# Patient Record
Sex: Female | Born: 1965 | Race: White | Hispanic: No | Marital: Married | Smoking: Never smoker
Health system: Southern US, Community
[De-identification: ages and names within clinical notes are randomized; demographics above are authoritative.]

## PROBLEM LIST (undated history)

## (undated) DIAGNOSIS — R112 Nausea with vomiting, unspecified: Secondary | ICD-10-CM

## (undated) DIAGNOSIS — K219 Gastro-esophageal reflux disease without esophagitis: Secondary | ICD-10-CM

## (undated) DIAGNOSIS — Z9889 Other specified postprocedural states: Secondary | ICD-10-CM

## (undated) DIAGNOSIS — Z8489 Family history of other specified conditions: Secondary | ICD-10-CM

## (undated) HISTORY — PX: DILATION AND CURETTAGE OF UTERUS: SHX78

## (undated) HISTORY — PX: RHINOPLASTY: SUR1284

## (undated) HISTORY — PX: EYE SURGERY: SHX253

---

## 2001-04-11 ENCOUNTER — Other Ambulatory Visit: Admission: RE | Admit: 2001-04-11 | Discharge: 2001-04-11 | Payer: Self-pay | Admitting: Obstetrics and Gynecology

## 2001-11-06 ENCOUNTER — Other Ambulatory Visit: Admission: RE | Admit: 2001-11-06 | Discharge: 2001-11-06 | Payer: Self-pay | Admitting: Obstetrics and Gynecology

## 2003-06-07 ENCOUNTER — Inpatient Hospital Stay (HOSPITAL_COMMUNITY): Admission: AD | Admit: 2003-06-07 | Discharge: 2003-06-09 | Payer: Self-pay | Admitting: Obstetrics

## 2008-03-26 ENCOUNTER — Encounter: Admission: RE | Admit: 2008-03-26 | Discharge: 2008-03-26 | Payer: Self-pay | Admitting: Obstetrics and Gynecology

## 2009-03-27 ENCOUNTER — Encounter: Admission: RE | Admit: 2009-03-27 | Discharge: 2009-03-27 | Payer: Self-pay | Admitting: Obstetrics and Gynecology

## 2010-03-31 ENCOUNTER — Encounter: Admission: RE | Admit: 2010-03-31 | Discharge: 2010-03-31 | Payer: Self-pay | Admitting: Obstetrics and Gynecology

## 2011-04-15 ENCOUNTER — Other Ambulatory Visit: Payer: Self-pay | Admitting: Obstetrics and Gynecology

## 2011-04-15 DIAGNOSIS — Z1231 Encounter for screening mammogram for malignant neoplasm of breast: Secondary | ICD-10-CM

## 2011-04-23 ENCOUNTER — Ambulatory Visit: Payer: Self-pay

## 2011-05-21 ENCOUNTER — Ambulatory Visit: Payer: Self-pay

## 2011-05-28 ENCOUNTER — Ambulatory Visit
Admission: RE | Admit: 2011-05-28 | Discharge: 2011-05-28 | Disposition: A | Discharge status: Home / Self Care | Payer: BC Managed Care – PPO | Source: Ambulatory Visit | Attending: Obstetrics and Gynecology | Admitting: Obstetrics and Gynecology

## 2011-05-28 DIAGNOSIS — Z1231 Encounter for screening mammogram for malignant neoplasm of breast: Secondary | ICD-10-CM

## 2012-02-14 ENCOUNTER — Other Ambulatory Visit: Payer: Self-pay | Admitting: Obstetrics and Gynecology

## 2012-02-14 DIAGNOSIS — N63 Unspecified lump in unspecified breast: Secondary | ICD-10-CM

## 2012-02-14 DIAGNOSIS — N6453 Retraction of nipple: Secondary | ICD-10-CM

## 2012-02-18 ENCOUNTER — Ambulatory Visit
Admission: RE | Admit: 2012-02-18 | Discharge: 2012-02-18 | Disposition: A | Discharge status: Home / Self Care | Payer: BC Managed Care – PPO | Source: Ambulatory Visit | Attending: Obstetrics and Gynecology | Admitting: Obstetrics and Gynecology

## 2012-02-18 DIAGNOSIS — N6453 Retraction of nipple: Secondary | ICD-10-CM

## 2012-02-18 DIAGNOSIS — N63 Unspecified lump in unspecified breast: Secondary | ICD-10-CM

## 2012-06-02 ENCOUNTER — Other Ambulatory Visit: Payer: Self-pay | Admitting: Obstetrics and Gynecology

## 2012-06-02 DIAGNOSIS — Z1231 Encounter for screening mammogram for malignant neoplasm of breast: Secondary | ICD-10-CM

## 2012-07-14 ENCOUNTER — Ambulatory Visit
Admission: RE | Admit: 2012-07-14 | Discharge: 2012-07-14 | Disposition: A | Discharge status: Home / Self Care | Payer: BC Managed Care – PPO | Source: Ambulatory Visit | Attending: Obstetrics and Gynecology | Admitting: Obstetrics and Gynecology

## 2012-07-14 DIAGNOSIS — Z1231 Encounter for screening mammogram for malignant neoplasm of breast: Secondary | ICD-10-CM

## 2013-10-04 ENCOUNTER — Ambulatory Visit: Payer: Self-pay | Admitting: Orthopedic Surgery

## 2013-12-13 ENCOUNTER — Other Ambulatory Visit: Payer: Self-pay | Admitting: Neurological Surgery

## 2014-01-07 ENCOUNTER — Other Ambulatory Visit: Payer: Self-pay

## 2014-01-07 DIAGNOSIS — Z1231 Encounter for screening mammogram for malignant neoplasm of breast: Secondary | ICD-10-CM

## 2014-01-10 ENCOUNTER — Ambulatory Visit: Payer: BC Managed Care – PPO

## 2014-01-14 ENCOUNTER — Ambulatory Visit: Payer: BC Managed Care – PPO

## 2014-01-17 ENCOUNTER — Encounter (HOSPITAL_COMMUNITY): Payer: Self-pay

## 2014-01-17 NOTE — Pre-Procedure Instructions (Signed)
Clarita CraneLisa Bracher  01/17/2014   Your procedure is scheduled on: Wednesday, August 5th  Report to [facility] Admitting at 1:00 PM.  Call this number if you have problems the morning of surgery: [phone]   Remember:   Do not eat food or drink liquids after midnight.   Take these medicines the morning of surgery with A SIP OF WATER: none   Do not wear jewelry, make-up or nail polish.  Do not wear lotions, powders, or perfumes. You may wear deodorant.  Do not shave 48 hours prior to surgery. Men may shave face and neck.  Do not bring valuables to the hospital.  [facility] is not responsible for any belongings or valuables.               Contacts, dentures or bridgework may not be worn into surgery.  Leave suitcase in the car. After surgery it may be brought to your room.  For patients admitted to the hospital, discharge time is determined by your treatment team.               Patients discharged the day of surgery will not be allowed to drive home.  Please read over the following fact sheets that you were given: Pain Booklet, Coughing and Deep Breathing, MRSA Information and Surgical Site Infection Prevention [place] - Preparing for Surgery  Before surgery, you can play an important role.  Because skin is not sterile, your skin needs to be as free of germs as possible.  You can reduce the number of germs on you skin by washing with CHG (chlorahexidine gluconate) soap before surgery.  CHG is an antiseptic cleaner which kills germs and bonds with the skin to continue killing germs even after washing.  Please DO NOT use if you have an allergy to CHG or antibacterial soaps.  If your skin becomes reddened/irritated stop using the CHG and inform your nurse when you arrive at Short Stay.  Do not shave (including legs and underarms) for at least 48 hours prior to the first CHG shower.  You may shave your face.  Please follow these instructions carefully:   1.  Shower with CHG Soap  the night before surgery and the morning of Surgery.  2.  If you choose to wash your hair, wash your hair first as usual with your normal shampoo.  3.  After you shampoo, rinse your hair and body thoroughly to remove the shampoo.  4.  Use CHG as you would any other liquid soap.  You can apply CHG directly to the skin and wash gently with scrungie or a clean washcloth.  5.  Apply the CHG Soap to your body ONLY FROM THE NECK DOWN.  Do not use on open wounds or open sores.  Avoid contact with your eyes, ears, mouth and genitals (private parts).  Wash genitals (private parts) with your normal soap.  6.  Wash thoroughly, paying special attention to the area where your surgery will be performed.  7.  Thoroughly rinse your body with warm water from the neck down.  8.  DO NOT shower/wash with your normal soap after using and rinsing off the CHG Soap.  9.  Pat yourself dry with a clean towel.            10.  Wear clean pajamas.            11.  Place clean sheets on your bed the night of your first shower and do  not sleep with pets.  Day of Surgery  Do not apply any lotions/deoderants the morning of surgery.  Please wear clean clothes to the hospital/surgery center.

## 2014-01-18 ENCOUNTER — Encounter (HOSPITAL_COMMUNITY)
Admission: RE | Admit: 2014-01-18 | Discharge: 2014-01-18 | Disposition: A | Discharge status: Home / Self Care | Payer: BC Managed Care – PPO | Source: Ambulatory Visit | Attending: Neurological Surgery | Admitting: Neurological Surgery

## 2014-01-18 ENCOUNTER — Encounter (HOSPITAL_COMMUNITY): Payer: Self-pay

## 2014-01-18 DIAGNOSIS — Z01818 Encounter for other preprocedural examination: Secondary | ICD-10-CM | POA: Insufficient documentation

## 2014-01-18 DIAGNOSIS — Z01812 Encounter for preprocedural laboratory examination: Secondary | ICD-10-CM | POA: Insufficient documentation

## 2014-01-18 DIAGNOSIS — Z01810 Encounter for preprocedural cardiovascular examination: Secondary | ICD-10-CM | POA: Insufficient documentation

## 2014-01-18 HISTORY — DX: Gastro-esophageal reflux disease without esophagitis: K21.9

## 2014-01-18 HISTORY — DX: Nausea with vomiting, unspecified: R11.2

## 2014-01-18 HISTORY — DX: Other specified postprocedural states: Z98.890

## 2014-01-18 HISTORY — DX: Family history of other specified conditions: Z84.89

## 2014-01-18 LAB — SURGICAL PCR SCREEN
MRSA, PCR: NEGATIVE
Staphylococcus aureus: NEGATIVE

## 2014-01-18 LAB — CBC WITH DIFFERENTIAL/PLATELET
BASOS ABS: 0 10*3/uL (ref 0.0–0.1)
Basophils Relative: 1 % (ref 0–1)
EOS ABS: 0.4 10*3/uL (ref 0.0–0.7)
EOS PCT: 7 % — AB (ref 0–5)
HEMATOCRIT: 38.9 % (ref 36.0–46.0)
Hemoglobin: 13 g/dL (ref 12.0–15.0)
LYMPHS PCT: 32 % (ref 12–46)
Lymphs Abs: 1.8 10*3/uL (ref 0.7–4.0)
MCH: 30.7 pg (ref 26.0–34.0)
MCHC: 33.4 g/dL (ref 30.0–36.0)
MCV: 91.7 fL (ref 78.0–100.0)
MONO ABS: 0.5 10*3/uL (ref 0.1–1.0)
Monocytes Relative: 8 % (ref 3–12)
Neutro Abs: 3 10*3/uL (ref 1.7–7.7)
Neutrophils Relative %: 52 % (ref 43–77)
PLATELETS: 321 10*3/uL (ref 150–400)
RBC: 4.24 MIL/uL (ref 3.87–5.11)
RDW: 12.9 % (ref 11.5–15.5)
WBC: 5.7 10*3/uL (ref 4.0–10.5)

## 2014-01-18 LAB — BASIC METABOLIC PANEL
Anion gap: 14 (ref 5–15)
BUN: 18 mg/dL (ref 6–23)
CALCIUM: 9.4 mg/dL (ref 8.4–10.5)
CO2: 25 meq/L (ref 19–32)
CREATININE: 0.59 mg/dL (ref 0.50–1.10)
Chloride: 102 mEq/L (ref 96–112)
GFR calc Af Amer: >90 mL/min (ref >90)
GLUCOSE: 86 mg/dL (ref 70–99)
Potassium: 4.1 mEq/L (ref 3.7–5.3)
Sodium: 141 mEq/L (ref 137–147)

## 2014-01-18 LAB — PROTIME-INR
INR: 0.94 (ref 0.00–1.49)
Prothrombin Time: 12.6 seconds (ref 11.6–15.2)

## 2014-01-18 LAB — HCG, SERUM, QUALITATIVE: Preg, Serum: NEGATIVE

## 2014-01-29 MED ORDER — DEXAMETHASONE SODIUM PHOSPHATE 10 MG/ML IJ SOLN
10.0000 mg | INTRAMUSCULAR | Status: AC
  Administered 2014-01-30: 10 mg via INTRAVENOUS
  Filled 2014-01-29: qty 1

## 2014-01-29 MED ORDER — CEFAZOLIN SODIUM-DEXTROSE 2-3 GM-% IV SOLR
2.0000 g | INTRAVENOUS | Status: AC
  Administered 2014-01-30: 2 g via INTRAVENOUS
  Filled 2014-01-29: qty 50

## 2014-01-30 ENCOUNTER — Encounter (HOSPITAL_COMMUNITY): Payer: Self-pay | Admitting: *Deleted

## 2014-01-30 ENCOUNTER — Ambulatory Visit (HOSPITAL_COMMUNITY): Payer: BC Managed Care – PPO | Admitting: Certified Registered Nurse Anesthetist

## 2014-01-30 ENCOUNTER — Encounter (HOSPITAL_COMMUNITY): Payer: BC Managed Care – PPO | Admitting: Certified Registered Nurse Anesthetist

## 2014-01-30 ENCOUNTER — Ambulatory Visit (HOSPITAL_COMMUNITY)
Admission: RE | Admit: 2014-01-30 | Discharge: 2014-01-31 | Disposition: A | Discharge status: Home / Self Care | Payer: BC Managed Care – PPO | Source: Ambulatory Visit | Attending: Neurological Surgery | Admitting: Neurological Surgery

## 2014-01-30 ENCOUNTER — Encounter (HOSPITAL_COMMUNITY)
Admission: RE | Disposition: A | Discharge status: Home / Self Care | Payer: Self-pay | Source: Ambulatory Visit | Attending: Neurological Surgery

## 2014-01-30 ENCOUNTER — Ambulatory Visit (HOSPITAL_COMMUNITY): Payer: BC Managed Care – PPO

## 2014-01-30 DIAGNOSIS — M5126 Other intervertebral disc displacement, lumbar region: Secondary | ICD-10-CM | POA: Diagnosis present

## 2014-01-30 DIAGNOSIS — Z981 Arthrodesis status: Secondary | ICD-10-CM

## 2014-01-30 DIAGNOSIS — Z6831 Body mass index (BMI) 31.0-31.9, adult: Secondary | ICD-10-CM | POA: Insufficient documentation

## 2014-01-30 DIAGNOSIS — K219 Gastro-esophageal reflux disease without esophagitis: Secondary | ICD-10-CM | POA: Insufficient documentation

## 2014-01-30 DIAGNOSIS — G8929 Other chronic pain: Secondary | ICD-10-CM | POA: Insufficient documentation

## 2014-01-30 HISTORY — PX: LUMBAR LAMINECTOMY/DECOMPRESSION MICRODISCECTOMY: SHX5026

## 2014-01-30 SURGERY — LUMBAR LAMINECTOMY/DECOMPRESSION MICRODISCECTOMY 1 LEVEL
Anesthesia: General | Site: Back | Laterality: Left

## 2014-01-30 MED ORDER — PHENYLEPHRINE HCL 10 MG/ML IJ SOLN
INTRAMUSCULAR | Status: DC | PRN
  Administered 2014-01-30: 80 ug via INTRAVENOUS

## 2014-01-30 MED ORDER — ACETAMINOPHEN 650 MG RE SUPP: 650.0000 mg | RECTAL | Status: DC | PRN

## 2014-01-30 MED ORDER — DEXAMETHASONE SODIUM PHOSPHATE 4 MG/ML IJ SOLN
4.0000 mg | Freq: Four times a day (QID) | INTRAMUSCULAR | Status: DC
  Filled 2014-01-30 (×4): qty 1

## 2014-01-30 MED ORDER — GLYCOPYRROLATE 0.2 MG/ML IJ SOLN
INTRAMUSCULAR | Status: DC | PRN
  Administered 2014-01-30: 0.4 mg via INTRAVENOUS

## 2014-01-30 MED ORDER — GLYCOPYRROLATE 0.2 MG/ML IJ SOLN
INTRAMUSCULAR | Status: AC
  Filled 2014-01-30: qty 2

## 2014-01-30 MED ORDER — FENTANYL CITRATE 0.05 MG/ML IJ SOLN
INTRAMUSCULAR | Status: AC
  Filled 2014-01-30: qty 5

## 2014-01-30 MED ORDER — SCOPOLAMINE 1 MG/3DAYS TD PT72
MEDICATED_PATCH | TRANSDERMAL | Status: DC | PRN
  Administered 2014-01-30: 1 via TRANSDERMAL

## 2014-01-30 MED ORDER — HEMOSTATIC AGENTS (NO CHARGE) OPTIME
TOPICAL | Status: DC | PRN
  Administered 2014-01-30: 1 via TOPICAL

## 2014-01-30 MED ORDER — DEXAMETHASONE 4 MG PO TABS
4.0000 mg | ORAL_TABLET | Freq: Four times a day (QID) | ORAL | Status: DC
  Administered 2014-01-30 – 2014-01-31 (×4): 4 mg via ORAL
  Filled 2014-01-30 (×5): qty 1

## 2014-01-30 MED ORDER — NEOSTIGMINE METHYLSULFATE 10 MG/10ML IV SOLN
INTRAVENOUS | Status: DC | PRN
  Administered 2014-01-30: 3 mg via INTRAVENOUS

## 2014-01-30 MED ORDER — SODIUM CHLORIDE 0.9 % IJ SOLN: 3.0000 mL | INTRAMUSCULAR | Status: DC | PRN

## 2014-01-30 MED ORDER — MEPERIDINE HCL 25 MG/ML IJ SOLN: 6.2500 mg | INTRAMUSCULAR | Status: DC | PRN

## 2014-01-30 MED ORDER — NEOSTIGMINE METHYLSULFATE 10 MG/10ML IV SOLN
INTRAVENOUS | Status: AC
  Filled 2014-01-30: qty 1

## 2014-01-30 MED ORDER — LIDOCAINE HCL (CARDIAC) 20 MG/ML IV SOLN
INTRAVENOUS | Status: AC
  Filled 2014-01-30: qty 5

## 2014-01-30 MED ORDER — ROCURONIUM BROMIDE 50 MG/5ML IV SOLN
INTRAVENOUS | Status: AC
  Filled 2014-01-30: qty 1

## 2014-01-30 MED ORDER — ONDANSETRON HCL 4 MG/2ML IJ SOLN: 4.0000 mg | INTRAMUSCULAR | Status: DC | PRN

## 2014-01-30 MED ORDER — MORPHINE SULFATE 2 MG/ML IJ SOLN: 1.0000 mg | INTRAMUSCULAR | Status: DC | PRN

## 2014-01-30 MED ORDER — DIPHENHYDRAMINE HCL 50 MG/ML IJ SOLN: 12.5000 mg | Freq: Once | INTRAMUSCULAR | Status: DC

## 2014-01-30 MED ORDER — OXYCODONE HCL 5 MG/5ML PO SOLN: 5.0000 mg | Freq: Once | ORAL | Status: DC | PRN

## 2014-01-30 MED ORDER — ONDANSETRON HCL 4 MG/2ML IJ SOLN
INTRAMUSCULAR | Status: DC | PRN
  Administered 2014-01-30: 4 mg via INTRAVENOUS

## 2014-01-30 MED ORDER — HYDROMORPHONE HCL PF 1 MG/ML IJ SOLN
INTRAMUSCULAR | Status: AC
  Filled 2014-01-30: qty 1

## 2014-01-30 MED ORDER — LACTATED RINGERS IV SOLN
INTRAVENOUS | Status: DC
  Administered 2014-01-30: 14:00:00 via INTRAVENOUS

## 2014-01-30 MED ORDER — PROMETHAZINE HCL 25 MG/ML IJ SOLN: 6.2500 mg | INTRAMUSCULAR | Status: DC | PRN

## 2014-01-30 MED ORDER — MIDAZOLAM HCL 2 MG/2ML IJ SOLN: 0.5000 mg | Freq: Once | INTRAMUSCULAR | Status: DC | PRN

## 2014-01-30 MED ORDER — ACETAMINOPHEN 325 MG PO TABS
650.0000 mg | ORAL_TABLET | ORAL | Status: DC | PRN
  Administered 2014-01-31: 650 mg via ORAL
  Filled 2014-01-30: qty 2

## 2014-01-30 MED ORDER — SCOPOLAMINE 1 MG/3DAYS TD PT72
1.0000 | MEDICATED_PATCH | Freq: Once | TRANSDERMAL | Status: DC
  Administered 2014-01-30: 1.5 mg via TRANSDERMAL

## 2014-01-30 MED ORDER — 0.9 % SODIUM CHLORIDE (POUR BTL) OPTIME
TOPICAL | Status: DC | PRN
  Administered 2014-01-30: 1000 mL

## 2014-01-30 MED ORDER — FENTANYL CITRATE 0.05 MG/ML IJ SOLN
INTRAMUSCULAR | Status: DC | PRN
  Administered 2014-01-30 (×3): 50 ug via INTRAVENOUS
  Administered 2014-01-30: 100 ug via INTRAVENOUS

## 2014-01-30 MED ORDER — BUPIVACAINE HCL (PF) 0.25 % IJ SOLN
INTRAMUSCULAR | Status: DC | PRN
  Administered 2014-01-30: 2 mL

## 2014-01-30 MED ORDER — SCOPOLAMINE 1 MG/3DAYS TD PT72
MEDICATED_PATCH | TRANSDERMAL | Status: AC
  Filled 2014-01-30: qty 1

## 2014-01-30 MED ORDER — PROPOFOL 10 MG/ML IV BOLUS
INTRAVENOUS | Status: AC
  Filled 2014-01-30: qty 20

## 2014-01-30 MED ORDER — CEFAZOLIN SODIUM 1-5 GM-% IV SOLN
1.0000 g | Freq: Three times a day (TID) | INTRAVENOUS | Status: AC
  Administered 2014-01-30 – 2014-01-31 (×2): 1 g via INTRAVENOUS
  Filled 2014-01-30 (×2): qty 50

## 2014-01-30 MED ORDER — MIDAZOLAM HCL 2 MG/2ML IJ SOLN
INTRAMUSCULAR | Status: AC
  Filled 2014-01-30: qty 2

## 2014-01-30 MED ORDER — SUCCINYLCHOLINE CHLORIDE 20 MG/ML IJ SOLN
INTRAMUSCULAR | Status: AC
  Filled 2014-01-30: qty 1

## 2014-01-30 MED ORDER — MENTHOL 3 MG MT LOZG: 1.0000 | LOZENGE | OROMUCOSAL | Status: DC | PRN

## 2014-01-30 MED ORDER — ROCURONIUM BROMIDE 100 MG/10ML IV SOLN
INTRAVENOUS | Status: DC | PRN
  Administered 2014-01-30: 40 mg via INTRAVENOUS

## 2014-01-30 MED ORDER — HYDROMORPHONE HCL PF 1 MG/ML IJ SOLN
0.2500 mg | INTRAMUSCULAR | Status: DC | PRN
  Administered 2014-01-30 (×2): 0.5 mg via INTRAVENOUS

## 2014-01-30 MED ORDER — LACTATED RINGERS IV SOLN
INTRAVENOUS | Status: DC | PRN
  Administered 2014-01-30 (×2): via INTRAVENOUS

## 2014-01-30 MED ORDER — OXYCODONE HCL 5 MG PO TABS: 5.0000 mg | ORAL_TABLET | Freq: Once | ORAL | Status: DC | PRN

## 2014-01-30 MED ORDER — SODIUM CHLORIDE 0.9 % IR SOLN
Status: DC | PRN
  Administered 2014-01-30: 17:00:00

## 2014-01-30 MED ORDER — PHENOL 1.4 % MT LIQD: 1.0000 | OROMUCOSAL | Status: DC | PRN

## 2014-01-30 MED ORDER — SODIUM CHLORIDE 0.9 % IV SOLN: 250.0000 mL | INTRAVENOUS | Status: DC

## 2014-01-30 MED ORDER — DIPHENHYDRAMINE HCL 50 MG/ML IJ SOLN
INTRAMUSCULAR | Status: DC | PRN
  Administered 2014-01-30: 12.5 mg via INTRAVENOUS

## 2014-01-30 MED ORDER — SODIUM CHLORIDE 0.9 % IJ SOLN: 3.0000 mL | Freq: Two times a day (BID) | INTRAMUSCULAR | Status: DC

## 2014-01-30 MED ORDER — THROMBIN 5000 UNITS EX SOLR
CUTANEOUS | Status: DC | PRN
  Administered 2014-01-30 (×2): 5000 [IU] via TOPICAL

## 2014-01-30 MED ORDER — ONDANSETRON HCL 4 MG/2ML IJ SOLN
INTRAMUSCULAR | Status: AC
  Filled 2014-01-30: qty 4

## 2014-01-30 MED ORDER — PROPOFOL 10 MG/ML IV BOLUS
INTRAVENOUS | Status: DC | PRN
  Administered 2014-01-30: 150 mg via INTRAVENOUS

## 2014-01-30 MED ORDER — MIDAZOLAM HCL 5 MG/5ML IJ SOLN
INTRAMUSCULAR | Status: DC | PRN
  Administered 2014-01-30: 2 mg via INTRAVENOUS

## 2014-01-30 MED ORDER — LIDOCAINE HCL (CARDIAC) 20 MG/ML IV SOLN
INTRAVENOUS | Status: DC | PRN
  Administered 2014-01-30: 30 mg via INTRAVENOUS

## 2014-01-30 MED ORDER — CYCLOBENZAPRINE HCL 10 MG PO TABS: 10.0000 mg | ORAL_TABLET | Freq: Three times a day (TID) | ORAL | Status: DC | PRN

## 2014-01-30 MED ORDER — POTASSIUM CHLORIDE IN NACL 20-0.9 MEQ/L-% IV SOLN
INTRAVENOUS | Status: DC
  Filled 2014-01-30 (×3): qty 1000

## 2014-01-30 MED ORDER — HYDROCODONE-ACETAMINOPHEN 5-325 MG PO TABS: 1.0000 | ORAL_TABLET | ORAL | Status: DC | PRN

## 2014-01-30 SURGICAL SUPPLY — 47 items
BAG DECANTER FOR FLEXI CONT (MISCELLANEOUS) ×3 IMPLANT
BENZOIN TINCTURE PRP APPL 2/3 (GAUZE/BANDAGES/DRESSINGS) ×3 IMPLANT
BUR MATCHSTICK NEURO 3.0 LAGG (BURR) ×3 IMPLANT
CANISTER SUCT 3000ML (MISCELLANEOUS) ×3 IMPLANT
CLOSURE WOUND 1/2 X4 (GAUZE/BANDAGES/DRESSINGS) ×1
CONT SPEC 4OZ CLIKSEAL STRL BL (MISCELLANEOUS) ×3 IMPLANT
DRAPE LAPAROTOMY 100X72X124 (DRAPES) ×3 IMPLANT
DRAPE MICROSCOPE LEICA (MISCELLANEOUS) ×3 IMPLANT
DRAPE POUCH INSTRU U-SHP 10X18 (DRAPES) ×3 IMPLANT
DRAPE SURG 17X23 STRL (DRAPES) ×3 IMPLANT
DRSG OPSITE 4X5.5 SM (GAUZE/BANDAGES/DRESSINGS) ×3 IMPLANT
DRSG OPSITE POSTOP 4X6 (GAUZE/BANDAGES/DRESSINGS) ×3 IMPLANT
DRSG TELFA 3X8 NADH (GAUZE/BANDAGES/DRESSINGS) ×3 IMPLANT
DURAPREP 26ML APPLICATOR (WOUND CARE) ×3 IMPLANT
ELECT REM PT RETURN 9FT ADLT (ELECTROSURGICAL) ×3
ELECTRODE REM PT RTRN 9FT ADLT (ELECTROSURGICAL) ×1 IMPLANT
GAUZE SPONGE 4X4 16PLY XRAY LF (GAUZE/BANDAGES/DRESSINGS) IMPLANT
GLOVE BIO SURGEON STRL SZ 6.5 (GLOVE) ×6 IMPLANT
GLOVE BIO SURGEON STRL SZ8 (GLOVE) ×9 IMPLANT
GLOVE BIO SURGEONS STRL SZ 6.5 (GLOVE) ×3
GLOVE BIOGEL PI IND STRL 6.5 (GLOVE) ×1 IMPLANT
GLOVE BIOGEL PI INDICATOR 6.5 (GLOVE) ×2
GLOVE INDICATOR 8.5 STRL (GLOVE) ×3 IMPLANT
GOWN STRL REUS W/ TWL LRG LVL3 (GOWN DISPOSABLE) ×1 IMPLANT
GOWN STRL REUS W/ TWL XL LVL3 (GOWN DISPOSABLE) ×2 IMPLANT
GOWN STRL REUS W/TWL 2XL LVL3 (GOWN DISPOSABLE) IMPLANT
GOWN STRL REUS W/TWL LRG LVL3 (GOWN DISPOSABLE) ×2
GOWN STRL REUS W/TWL XL LVL3 (GOWN DISPOSABLE) ×4
HEMOSTAT POWDER KIT SURGIFOAM (HEMOSTASIS) ×3 IMPLANT
KIT BASIN OR (CUSTOM PROCEDURE TRAY) ×3 IMPLANT
KIT ROOM TURNOVER OR (KITS) ×3 IMPLANT
NEEDLE HYPO 25X1 1.5 SAFETY (NEEDLE) ×3 IMPLANT
NEEDLE SPNL 20GX3.5 QUINCKE YW (NEEDLE) IMPLANT
NS IRRIG 1000ML POUR BTL (IV SOLUTION) ×3 IMPLANT
PACK LAMINECTOMY NEURO (CUSTOM PROCEDURE TRAY) ×3 IMPLANT
PAD ARMBOARD 7.5X6 YLW CONV (MISCELLANEOUS) ×9 IMPLANT
RUBBERBAND STERILE (MISCELLANEOUS) ×6 IMPLANT
SPONGE SURGIFOAM ABS GEL SZ50 (HEMOSTASIS) ×3 IMPLANT
STRIP CLOSURE SKIN 1/2X4 (GAUZE/BANDAGES/DRESSINGS) ×2 IMPLANT
SUT VIC AB 0 CT1 18XCR BRD8 (SUTURE) ×1 IMPLANT
SUT VIC AB 0 CT1 8-18 (SUTURE) ×2
SUT VIC AB 2-0 CP2 18 (SUTURE) ×3 IMPLANT
SUT VIC AB 3-0 SH 8-18 (SUTURE) ×3 IMPLANT
SYR 20ML ECCENTRIC (SYRINGE) ×3 IMPLANT
TOWEL OR 17X24 6PK STRL BLUE (TOWEL DISPOSABLE) ×3 IMPLANT
TOWEL OR 17X26 10 PK STRL BLUE (TOWEL DISPOSABLE) ×3 IMPLANT
WATER STERILE IRR 1000ML POUR (IV SOLUTION) ×3 IMPLANT

## 2014-01-30 NOTE — H&P (Signed)
Subjective: Patient is a 48 y.o. female admitted for lumbar microdiscectomy L4-5. Onset of symptoms was several weeks ago, rapidly worsening since that time.  The pain is rated severe, and is located at the across the lower back and radiates to legs. The pain is described as aching and occurs all day. The symptoms have been progressive. Symptoms are exacerbated by exercise. MRI or CT showed herniated nucleus pulposus L4-5   Past Medical History  Diagnosis Date  . PONV (postoperative nausea and vomiting)   . Family history of anesthesia complication     mother has alot of allergies  . GERD (gastroesophageal reflux disease)     ?? d/t anti inflammatory    Past Surgical History  Procedure Laterality Date  . Rhinoplasty      20 yr ago  . Eye surgery      lasik in right eye  . Dilation and curettage of uterus      abortion  . Vaginal delivery       x 1    Prior to Admission medications   Medication Sig Start Date End Date Taking? Authorizing Provider  calcium carbonate (TUMS - DOSED IN MG ELEMENTAL CALCIUM) 500 MG chewable tablet Chew 2 tablets by mouth daily.   Yes Historical Provider, MD  Calcium Carbonate-Vitamin D (CALCIUM + D PO) Take 1 tablet by mouth daily.   Yes Historical Provider, MD  ibuprofen (ADVIL,MOTRIN) 200 MG tablet Take 600 mg by mouth every 6 (six) hours as needed for mild pain.   Yes Historical Provider, MD  Multiple Vitamins-Minerals (MULTIVITAMIN WITH MINERALS) tablet Take 1 tablet by mouth daily.   Yes Historical Provider, MD   No Known Allergies  History  Substance Use Topics  . Smoking status: Never Smoker   . Smokeless tobacco: Not on file  . Alcohol Use: 1.8 oz/week    3 Glasses of wine per week    History reviewed. No pertinent family history.   Review of Systems  Positive ROS: neg  All other systems have been reviewed and were otherwise negative with the exception of those mentioned in the HPI and as above.  Objective: Vital signs in last 24  hours: Temp:  [97.8 F (36.6 C)] 97.8 F (36.6 C) (08/05 1303) Resp:  [20] 20 (08/05 1303) BP: (120)/(65) 120/65 mmHg (08/05 1303) SpO2:  [97 %] 97 % (08/05 1303) Weight:  [77.111 kg (170 lb)] 77.111 kg (170 lb) (08/05 1303)  General Appearance: Alert, cooperative, no distress, appears stated age Head: Normocephalic, without obvious abnormality, atraumatic Eyes: PERRL, conjunctiva/corneas clear, EOM's intact    Neck: Supple, symmetrical, trachea midline Back: Symmetric, no curvature, ROM normal, no CVA tenderness Lungs:  respirations unlabored Heart: Regular rate and rhythm Abdomen: Soft, non-tender Extremities: Extremities normal, atraumatic, no cyanosis or edema Pulses: 2+ and symmetric all extremities Skin: Skin color, texture, turgor normal, no rashes or lesions  NEUROLOGIC:   Mental status: Alert and oriented x4,  no aphasia, good attention span, fund of knowledge, and memory Motor Exam - grossly normal Sensory Exam - grossly normal Reflexes: 1+ Coordination - grossly normal Gait - grossly normal Balance - grossly normal Cranial Nerves: I: smell Not tested  II: visual acuity  OS: nl    OD: nl  II: visual fields Full to confrontation  II: pupils Equal, round, reactive to light  III,VII: ptosis None  III,IV,VI: extraocular muscles  Full ROM  V: mastication Normal  V: facial light touch sensation  Normal  V,VII: corneal reflex  Present  VII: facial muscle function - upper  Normal  VII: facial muscle function - lower Normal  VIII: hearing Not tested  IX: soft palate elevation  Normal  IX,X: gag reflex Present  XI: trapezius strength  5/5  XI: sternocleidomastoid strength 5/5  XI: neck flexion strength  5/5  XII: tongue strength  Normal    Data Review Lab Results  Component Value Date   WBC 5.7 01/18/2014   HGB 13.0 01/18/2014   HCT 38.9 01/18/2014   MCV 91.7 01/18/2014   PLT 321 01/18/2014   Lab Results  Component Value Date   NA 141 01/18/2014   K 4.1  01/18/2014   CL 102 01/18/2014   CO2 25 01/18/2014   BUN 18 01/18/2014   CREATININE 0.59 01/18/2014   GLUCOSE 86 01/18/2014   Lab Results  Component Value Date   INR 0.94 01/18/2014    Assessment/Plan: Patient admitted for lumbar microdiscectomy. Patient has failed a reasonable attempt at conservative therapy.  I explained the condition and procedure to the patient and answered any questions.  Patient wishes to proceed with procedure as planned. Understands risks/ benefits and typical outcomes of procedure.   Dorothy Hernandez S 01/30/2014 3:32 PM

## 2014-01-30 NOTE — Plan of Care (Signed)
Problem: Consults Goal: Diagnosis - Spinal Surgery Outcome: Completed/Met Date Met:  01/30/14 Microdiscectomy     

## 2014-01-30 NOTE — Transfer of Care (Signed)
Immediate Anesthesia Transfer of Care Note  Patient: Dorothy CraneLisa Hernandez  Procedure(s) Performed: Procedure(s) with comments: LUMBAR LAMINECTOMY/DECOMPRESSION MICRODISCECTOMY Left Four-Five  (Left) - LUMBAR LAMINECTOMY/DECOMPRESSION MICRODISCECTOMY Left Four-Five   Patient Location: PACU  Anesthesia Type:General  Level of Consciousness: awake, alert  and patient cooperative  Airway & Oxygen Therapy: Patient Spontanous Breathing and Patient connected to nasal cannula oxygen  Post-op Assessment: Report given to PACU RN and Post -op Vital signs reviewed and stable  Post vital signs: Reviewed and stable  Complications: No apparent anesthesia complications

## 2014-01-30 NOTE — Op Note (Signed)
01/30/2014  5:38 PM  PATIENT:  Dorothy CraneLisa Hernandez  48 y.o. 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 Wynetta Emeryram  ANESTHESIA:   General  EBL: 100 ml  Total I/O In: 1000 [I.V.:1000] Out: 100 [Blood:100]  BLOOD ADMINISTERED:none  DRAINS: None   SPECIMEN:  No Specimen  INDICATION FOR PROCEDURE: This patient presented with severe left leg pain. She MRI which showed a large left-sided disc herniation at L4-5. She tried medical management without relief. I recommended left L4-5 microdiscectomy. Patient understood the risks, benefits, and alternatives and potential outcomes and wished to proceed.  PROCEDURE DETAILS: The patient was taken to the operating room and after induction of adequate generalized endotracheal anesthesia, the patient was rolled into the prone position on the Wilson frame and all pressure points were padded. The lumbar region was cleaned and then prepped with DuraPrep and draped in the usual sterile fashion. 5 cc of local anesthesia was injected and then a dorsal midline incision was made and carried down to the lumbo sacral fascia. The fascia was opened and the paraspinous musculature was taken down in a subperiosteal fashion to expose L4-5 on the left. Intraoperative x-ray confirmed my level, and then I used a combination of the high-speed drill and the Kerrison punches to perform a hemilaminectomy, medial facetectomy, and foraminotomy at L4-5 on the left. The underlying yellow ligament was opened and removed in a piecemeal fashion to expose the underlying dura and exiting nerve root. I undercut the lateral recess and dissected down until I was medial to and distal to the pedicle. The nerve root was well decompressed.  We then gently retracted the nerve root medially with a retractor, coagulated the epidural venous vasculature, and incised the disc space. I performed a thorough intradiscal discectomy with pituitary rongeurs and curettes, until I had a nice decompression of the nerve root and the midline. I then palpated with a coronary dilator along the nerve root and into the foramen to assure adequate decompression. I felt no more compression of the nerve root. I irrigated with saline solution containing bacitracin. Achieved hemostasis with bipolar cautery, lined the dura with Gelfoam, and then closed the fascia with 0 Vicryl. I closed the subcutaneous tissues with 2-0 Vicryl and the subcuticular tissues with 3-0 Vicryl. The skin was then closed with benzoin and Steri-Strips. The drapes were removed, a sterile dressing was applied. The patient was awakened from general anesthesia and transferred to the recovery room in stable condition. At the end of the procedure all sponge, needle and instrument counts were correct.   PLAN OF CARE: Admit for overnight observation  PATIENT DISPOSITION:  PACU - hemodynamically stable.   Delay start of Pharmacological VTE agent (>24hrs) due to surgical blood loss or risk of bleeding:  yes

## 2014-01-30 NOTE — Anesthesia Preprocedure Evaluation (Addendum)
Anesthesia Evaluation  Patient identified by MRN, date of birth, ID band Patient awake    Reviewed: Allergy & Precautions, H&P , NPO status , Patient's Chart, lab work & pertinent test results  History of Anesthesia Complications (+) PONV and history of anesthetic complications  Airway Mallampati: II TM Distance: >3 FB Neck ROM: Full    Dental  (+) Teeth Intact, Dental Advisory Given   Pulmonary neg pulmonary ROS,  breath sounds clear to auscultation  Pulmonary exam normal       Cardiovascular negative cardio ROS  Rhythm:Regular Rate:Normal     Neuro/Psych Chronic back pain: NSAIDs Left pain > right    GI/Hepatic Neg liver ROS, GERD-  Medicated and Controlled,  Endo/Other  Morbid obesity  Renal/GU negative Renal ROS     Musculoskeletal   Abdominal (+) + obese,   Peds  Hematology negative hematology ROS (+)   Anesthesia Other Findings   Reproductive/Obstetrics 01/18/14 preg test: NEG                         Anesthesia Physical Anesthesia Plan  ASA: II  Anesthesia Plan: General   Post-op Pain Management:    Induction: Intravenous  Airway Management Planned: Oral ETT  Additional Equipment:   Intra-op Plan:   Post-operative Plan: Extubation in OR  Informed Consent: I have reviewed the patients History and Physical, chart, labs and discussed the procedure including the risks, benefits and alternatives for the proposed anesthesia with the patient or authorized representative who has indicated his/her understanding and acceptance.   Dental advisory given  Plan Discussed with: Surgeon and CRNA  Anesthesia Plan Comments: (Plan routine monitors, GETA)        Anesthesia Quick Evaluation

## 2014-01-30 NOTE — Anesthesia Procedure Notes (Signed)
Procedure Name: Intubation Date/Time: 01/30/2014 4:18 PM Performed by: Orvilla FusATO, Larrell Rapozo A Pre-anesthesia Checklist: Patient identified, Timeout performed, Emergency Drugs available, Suction available and Patient being monitored Patient Re-evaluated:Patient Re-evaluated prior to inductionOxygen Delivery Method: Circle system utilized Preoxygenation: Pre-oxygenation with 100% oxygen Intubation Type: IV induction Ventilation: Mask ventilation without difficulty Laryngoscope Size: Mac and 3 Grade View: Grade I Tube type: Oral Tube size: 7.0 mm Number of attempts: 1 Airway Equipment and Method: Stylet Placement Confirmation: ETT inserted through vocal cords under direct vision,  positive ETCO2 and breath sounds checked- equal and bilateral Secured at: 21 cm Tube secured with: Tape Dental Injury: Teeth and Oropharynx as per pre-operative assessment

## 2014-01-31 ENCOUNTER — Encounter (HOSPITAL_COMMUNITY): Payer: Self-pay | Admitting: Neurological Surgery

## 2014-01-31 DIAGNOSIS — M5126 Other intervertebral disc displacement, lumbar region: Secondary | ICD-10-CM | POA: Diagnosis not present

## 2014-01-31 MED ORDER — ALUM & MAG HYDROXIDE-SIMETH 200-200-20 MG/5ML PO SUSP
30.0000 mL | ORAL | Status: DC | PRN
  Administered 2014-01-31 (×2): 30 mL via ORAL
  Filled 2014-01-31 (×2): qty 30

## 2014-01-31 NOTE — Progress Notes (Signed)
Patient alert and oriented, mae's well, voiding adequate amount of urine, swallowing without difficulty, no c/o pain. Patient discharged home with family. Script and discharged instructions given to patient. Patient and family stated understanding of instructions given.  

## 2014-01-31 NOTE — Discharge Summary (Signed)
Physician Discharge Summary  Patient ID: Dorothy Hernandez MRN: [id number] DOB/AGE: 47/08/1965 47 y.o.  Admit date: 01/30/2014 Discharge date: 01/31/2014  Admission Diagnoses: L4-5 herniated nucleus pulposus    Discharge Diagnoses: Same   Discharged Condition: good  Hospital Course: The patient was admitted on 01/30/2014 and taken to the operating room where the patient underwent L4-5 microdiscectomy. The patient tolerated the procedure well and was taken to the recovery room and then to the floor in stable condition. The hospital course was routine. There were no complications. The wound remained clean dry and intact. Pt had appropriate back soreness. No complaints of leg pain or new N/T/W. The patient remained afebrile with stable vital signs, and tolerated a regular diet. The patient continued to increase activities, and pain was well controlled with oral pain medications.   Consults: None  Significant Diagnostic Studies:  Results for orders placed during the hospital encounter of 01/18/14  SURGICAL PCR SCREEN      Result Value Ref Range   MRSA, PCR NEGATIVE  NEGATIVE   Staphylococcus aureus NEGATIVE  NEGATIVE  HCG, SERUM, QUALITATIVE      Result Value Ref Range   Preg, Serum NEGATIVE  NEGATIVE  BASIC METABOLIC PANEL      Result Value Ref Range   Sodium 141  137 - 147 mEq/L   Potassium 4.1  3.7 - 5.3 mEq/L   Chloride 102  96 - 112 mEq/L   CO2 25  19 - 32 mEq/L   Glucose, Bld 86  70 - 99 mg/dL   BUN 18  6 - 23 mg/dL   Creatinine, Ser 0.860.59  0.50 - 1.10 mg/dL   Calcium 9.4  8.4 - 57.810.5 mg/dL   GFR calc non Af Amer >90  >90 mL/min   GFR calc Af Amer >90  >90 mL/min   Anion gap 14  5 - 15  CBC WITH DIFFERENTIAL      Result Value Ref Range   WBC 5.7  4.0 - 10.5 K/uL   RBC 4.24  3.87 - 5.11 MIL/uL   Hemoglobin 13.0  12.0 - 15.0 g/dL   HCT 46.938.9  62.936.0 - 52.846.0 %   MCV 91.7  78.0 - 100.0 fL   MCH 30.7  26.0 - 34.0 pg   MCHC 33.4  30.0 - 36.0 g/dL   RDW 41.312.9  24.411.5 - 01.015.5 %   Platelets  321  150 - 400 K/uL   Neutrophils Relative % 52  43 - 77 %   Neutro Abs 3.0  1.7 - 7.7 K/uL   Lymphocytes Relative 32  12 - 46 %   Lymphs Abs 1.8  0.7 - 4.0 K/uL   Monocytes Relative 8  3 - 12 %   Monocytes Absolute 0.5  0.1 - 1.0 K/uL   Eosinophils Relative 7 (*) 0 - 5 %   Eosinophils Absolute 0.4  0.0 - 0.7 K/uL   Basophils Relative 1  0 - 1 %   Basophils Absolute 0.0  0.0 - 0.1 K/uL  PROTIME-INR      Result Value Ref Range   Prothrombin Time 12.6  11.6 - 15.2 seconds   INR 0.94  0.00 - 1.49    Chest 2 View  01/18/2014   CLINICAL DATA:  Preop for laminectomy  EXAM: CHEST  2 VIEW  COMPARISON:  None.  FINDINGS: Cardiomediastinal silhouette is unremarkable. Mild lower thoracic dextroscoliosis. No acute infiltrate or pleural effusion. No pulmonary edema.  IMPRESSION: No active cardiopulmonary disease.   Electronically  Signed   By: Natasha Mead M.D.   On: 01/18/2014 09:20   Dg Lumbar Spine 2-3 Views  01/30/2014   CLINICAL DATA:  Herniated Nucleus Pulposus  EXAM: LUMBAR SPINE - 2-3 VIEW  COMPARISON:  MR 10/04/2013  FINDINGS: The first lateral intraoperative radiograph shows needle from posterior approach projecting below the L4 spinous process, the second film demonstrates posterior instruments, a probe projecting below the L3 spinous process  IMPRESSION: 1. Intraoperative localization.   Electronically Signed   By: Oley Balm M.D.   On: 01/30/2014 17:08    Antibiotics:  Anti-infectives   Start     Dose/Rate Route Frequency Ordered Stop   01/30/14 1900  ceFAZolin (ANCEF) IVPB 1 g/50 mL premix     1 g 100 mL/hr over 30 Minutes Intravenous Every 8 hours 01/30/14 1848 01/31/14 0150   01/30/14 1700  bacitracin 50,000 Units in sodium chloride irrigation 0.9 % 500 mL irrigation  Status:  Discontinued       As needed 01/30/14 1700 01/30/14 1751   01/30/14 0600  ceFAZolin (ANCEF) IVPB 2 g/50 mL premix     2 g 100 mL/hr over 30 Minutes Intravenous On call to O.R. 01/29/14 1223 01/30/14 1619       Discharge Exam: Blood pressure 96/63, pulse 99, temperature 98.4 F (36.9 C), temperature source Oral, resp. rate 16, height 5\' 2"  (1.575 m), weight 77.111 kg (170 lb), SpO2 93.00%. Neurologic: Grossly normal, good strength in lower extremities Incision clean intact  Discharge Medications:     Medication List         CALCIUM + D PO  Take 1 tablet by mouth daily.     calcium carbonate 500 MG chewable tablet  Commonly known as:  TUMS - dosed in mg elemental calcium  Chew 2 tablets by mouth daily.     ibuprofen 200 MG tablet  Commonly known as:  ADVIL,MOTRIN  Take 600 mg by mouth every 6 (six) hours as needed for mild pain.     multivitamin with minerals tablet  Take 1 tablet by mouth daily.        Disposition: Home   Final Dx: L4-5 micro-disc      Discharge Instructions   Call MD for:  difficulty breathing, headache or visual disturbances    Complete by:  As directed      Call MD for:  persistant nausea and vomiting    Complete by:  As directed      Call MD for:  redness, tenderness, or signs of infection (pain, swelling, redness, odor or green/yellow discharge around incision site)    Complete by:  As directed      Call MD for:  severe uncontrolled pain    Complete by:  As directed      Call MD for:  temperature >100.4    Complete by:  As directed      Diet - low sodium heart healthy    Complete by:  As directed      Discharge instructions    Complete by:  As directed   No prolonged sitting, no driving, no lifting more than 8 pounds, and no repetitive bending or twisting, no strenuous activity     Increase activity slowly    Complete by:  As directed      Remove dressing in 48 hours    Complete by:  As directed            Follow-up Information   Follow up with JONES,DAVID  S, MD In 2 weeks.   Specialty:  Neurosurgery   Contact information:   [address] [place] [address] [phone]        Signed: Tia Alert 01/31/2014,  11:32 AM

## 2014-01-31 NOTE — Anesthesia Postprocedure Evaluation (Signed)
  Anesthesia Post-op Note  Patient: Dorothy Hernandez  Procedure(s) Performed: Procedure(s) with comments: LUMBAR LAMINECTOMY/DECOMPRESSION MICRODISCECTOMY Left Four-Five  (Left) - LUMBAR LAMINECTOMY/DECOMPRESSION MICRODISCECTOMY Left Four-Five   Patient Location: PACU  Anesthesia Type:General  Level of Consciousness: awake and alert   Airway and Oxygen Therapy: Patient Spontanous Breathing  Post-op Pain: mild  Post-op Assessment: Post-op Vital signs reviewed  Post-op Vital Signs: stable  Last Vitals:  Filed Vitals:   01/31/14 0745  BP: 96/63  Pulse: 99  Temp: 36.9 C  Resp: 16    Complications: No apparent anesthesia complications

## 2014-02-18 ENCOUNTER — Ambulatory Visit: Payer: BC Managed Care – PPO

## 2014-02-21 ENCOUNTER — Ambulatory Visit
Admission: RE | Admit: 2014-02-21 | Discharge: 2014-02-21 | Disposition: A | Discharge status: Home / Self Care | Payer: BC Managed Care – PPO | Source: Ambulatory Visit

## 2014-02-21 DIAGNOSIS — Z1231 Encounter for screening mammogram for malignant neoplasm of breast: Secondary | ICD-10-CM

## 2015-06-20 ENCOUNTER — Other Ambulatory Visit: Payer: Self-pay

## 2015-06-20 DIAGNOSIS — Z1231 Encounter for screening mammogram for malignant neoplasm of breast: Secondary | ICD-10-CM

## 2015-06-27 ENCOUNTER — Ambulatory Visit
Admission: RE | Admit: 2015-06-27 | Discharge: 2015-06-27 | Disposition: A | Discharge status: Home / Self Care | Payer: BLUE CROSS/BLUE SHIELD | Source: Ambulatory Visit

## 2015-06-27 DIAGNOSIS — Z1231 Encounter for screening mammogram for malignant neoplasm of breast: Secondary | ICD-10-CM

## 2015-07-02 ENCOUNTER — Other Ambulatory Visit: Payer: Self-pay | Admitting: Obstetrics and Gynecology

## 2015-07-02 DIAGNOSIS — R928 Other abnormal and inconclusive findings on diagnostic imaging of breast: Secondary | ICD-10-CM

## 2015-07-03 ENCOUNTER — Ambulatory Visit
Admission: RE | Admit: 2015-07-03 | Discharge: 2015-07-03 | Disposition: A | Discharge status: Home / Self Care | Payer: Managed Care, Other (non HMO) | Source: Ambulatory Visit | Attending: Obstetrics and Gynecology | Admitting: Obstetrics and Gynecology

## 2015-07-03 DIAGNOSIS — R928 Other abnormal and inconclusive findings on diagnostic imaging of breast: Secondary | ICD-10-CM

## 2016-08-25 ENCOUNTER — Other Ambulatory Visit: Payer: Self-pay | Admitting: Certified Nurse Midwife

## 2016-08-25 DIAGNOSIS — N6001 Solitary cyst of right breast: Secondary | ICD-10-CM

## 2016-09-06 ENCOUNTER — Other Ambulatory Visit: Payer: Managed Care, Other (non HMO)

## 2016-09-10 ENCOUNTER — Ambulatory Visit
Admission: RE | Admit: 2016-09-10 | Discharge: 2016-09-10 | Disposition: A | Discharge status: Home / Self Care | Payer: Managed Care, Other (non HMO) | Source: Ambulatory Visit | Attending: Certified Nurse Midwife | Admitting: Certified Nurse Midwife

## 2016-09-10 ENCOUNTER — Other Ambulatory Visit: Payer: Self-pay | Admitting: Certified Nurse Midwife

## 2016-09-10 DIAGNOSIS — N6001 Solitary cyst of right breast: Secondary | ICD-10-CM

## 2017-11-07 ENCOUNTER — Other Ambulatory Visit: Payer: Self-pay | Admitting: Certified Nurse Midwife

## 2017-11-07 DIAGNOSIS — N6320 Unspecified lump in the left breast, unspecified quadrant: Secondary | ICD-10-CM

## 2017-11-15 ENCOUNTER — Other Ambulatory Visit: Payer: Self-pay | Admitting: Certified Nurse Midwife

## 2017-11-15 DIAGNOSIS — N6320 Unspecified lump in the left breast, unspecified quadrant: Secondary | ICD-10-CM

## 2017-11-16 ENCOUNTER — Ambulatory Visit
Admission: RE | Admit: 2017-11-16 | Discharge: 2017-11-16 | Disposition: A | Discharge status: Home / Self Care | Payer: Managed Care, Other (non HMO) | Source: Ambulatory Visit | Attending: Certified Nurse Midwife | Admitting: Certified Nurse Midwife

## 2017-11-16 DIAGNOSIS — N6320 Unspecified lump in the left breast, unspecified quadrant: Secondary | ICD-10-CM

## 2018-08-16 ENCOUNTER — Other Ambulatory Visit: Payer: Self-pay | Admitting: Obstetrics and Gynecology

## 2018-08-16 DIAGNOSIS — N6453 Retraction of nipple: Secondary | ICD-10-CM

## 2018-08-30 ENCOUNTER — Ambulatory Visit
Admission: RE | Admit: 2018-08-30 | Discharge: 2018-08-30 | Disposition: A | Discharge status: Home / Self Care | Payer: Managed Care, Other (non HMO) | Source: Ambulatory Visit | Attending: Obstetrics and Gynecology | Admitting: Obstetrics and Gynecology

## 2018-08-30 ENCOUNTER — Ambulatory Visit
Admission: RE | Admit: 2018-08-30 | Discharge: 2018-08-30 | Disposition: A | Discharge status: Home / Self Care | Payer: PRIVATE HEALTH INSURANCE | Source: Ambulatory Visit | Attending: Obstetrics and Gynecology | Admitting: Obstetrics and Gynecology

## 2018-08-30 DIAGNOSIS — N6453 Retraction of nipple: Secondary | ICD-10-CM

## 2019-08-14 IMAGING — MG DIGITAL DIAGNOSTIC UNILATERAL LEFT MAMMOGRAM WITH TOMO AND CAD
6 series · 6 of 18 positions shown · non-contrast
Comparison: Previous exam(s).

CLINICAL DATA: Patient sent for diagnostic imaging for a reported
inverted left nipple. The patient is referring clinician reports
inverted nipple. The patient does not notice any inversion or
change.

EXAM:
DIGITAL DIAGNOSTIC LEFT MAMMOGRAM WITH CAD AND TOMO
ULTRASOUND LEFT BREAST

[L MLO synth-2D (1 of 2)]
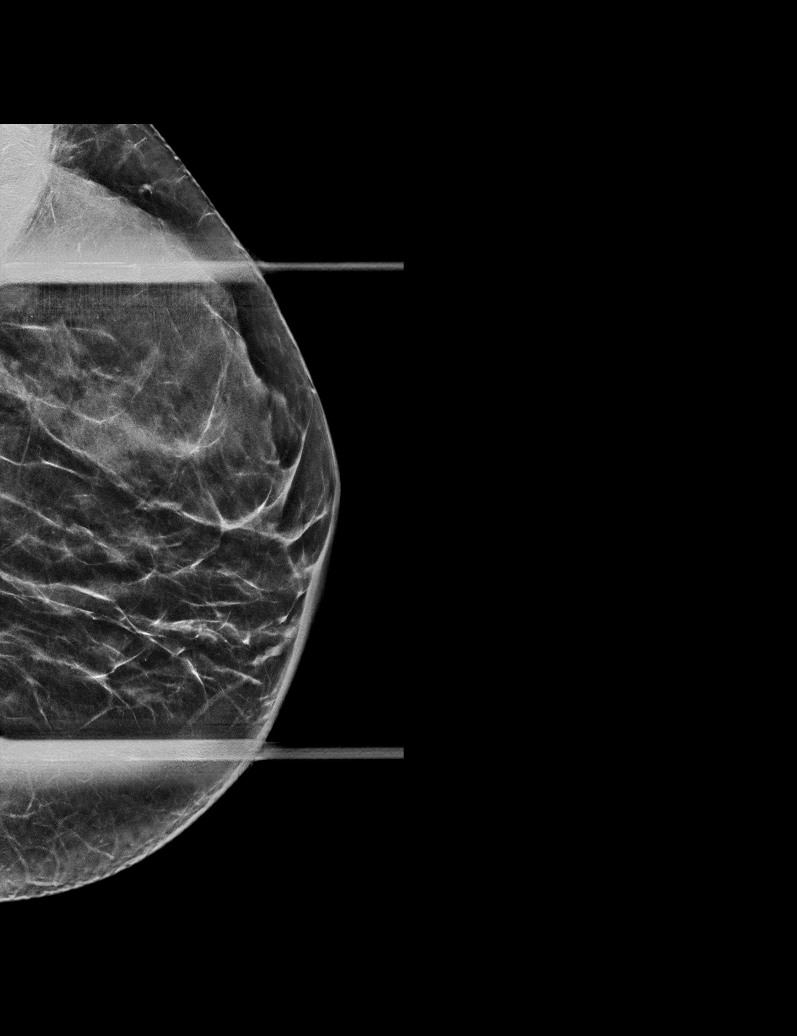

[L MLO synth-2D (2 of 2)]
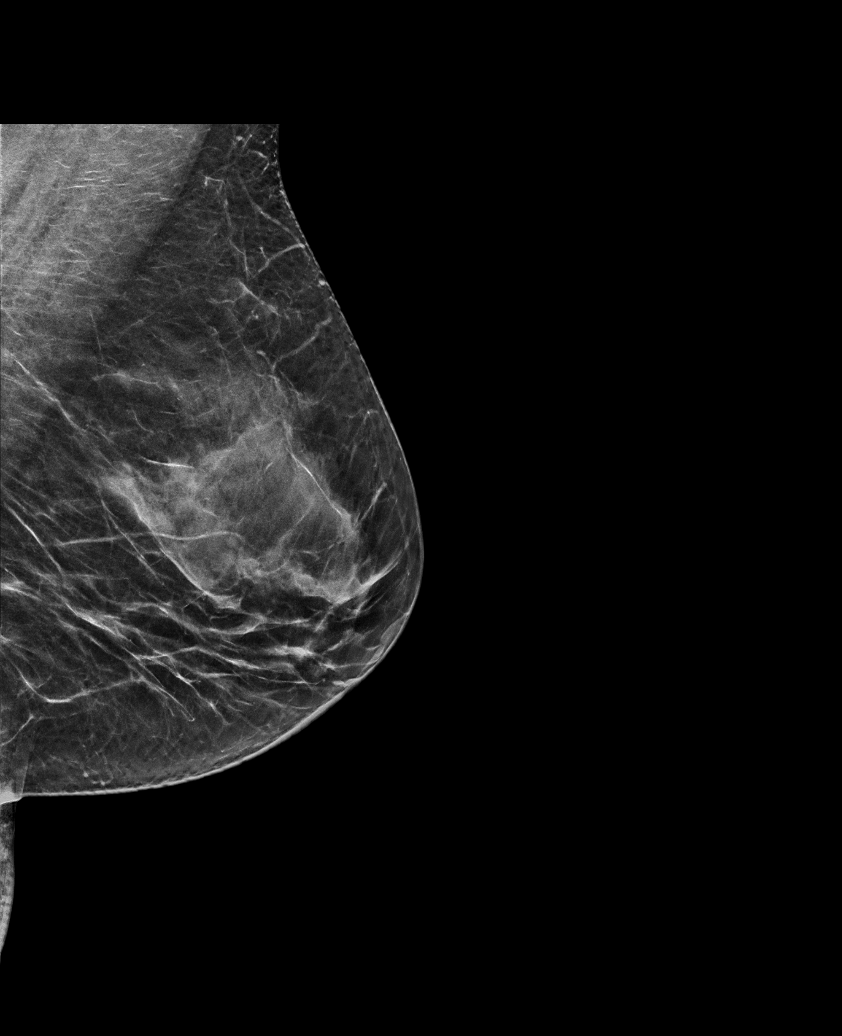

[L CC synth-2D]
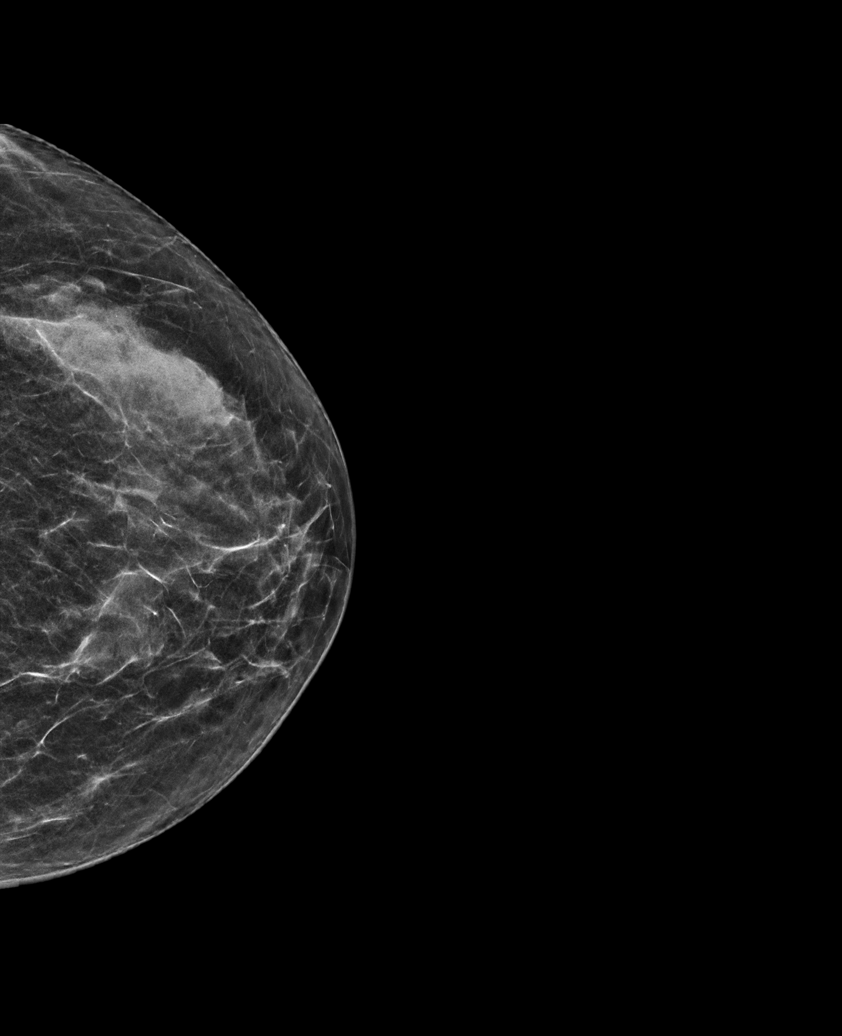

[L MLO tomo (1 of 2) · tomo slice 26/51.0]
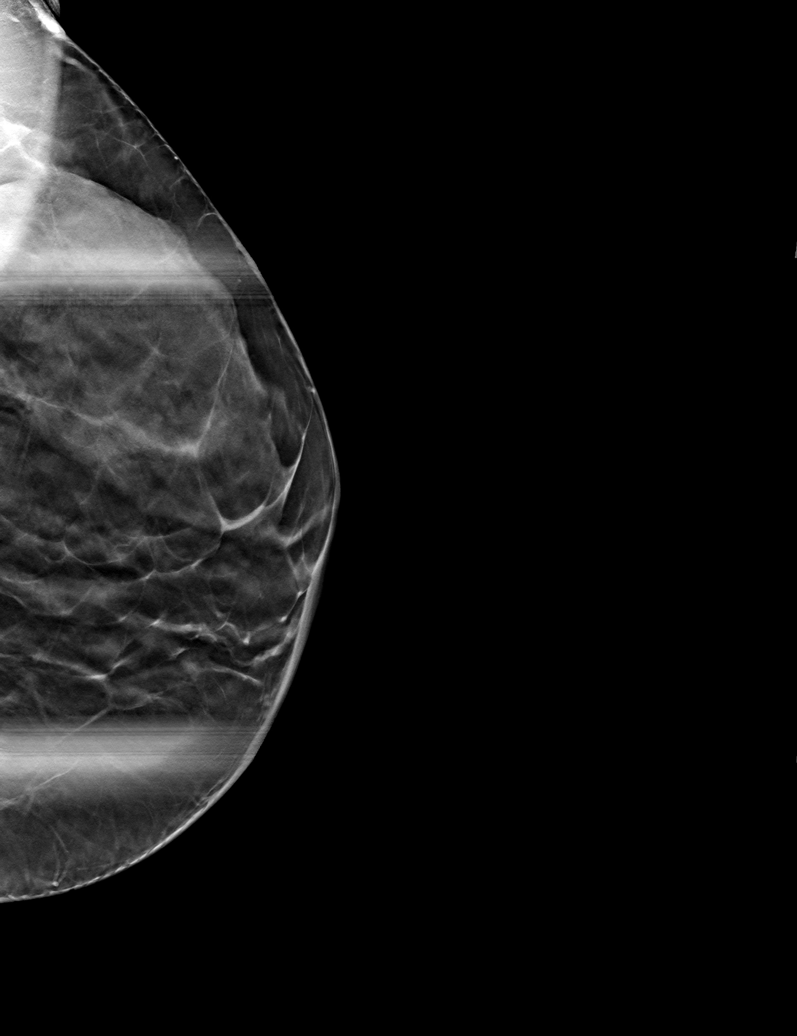

[L MLO tomo (2 of 2) · tomo slice 29/56.0]
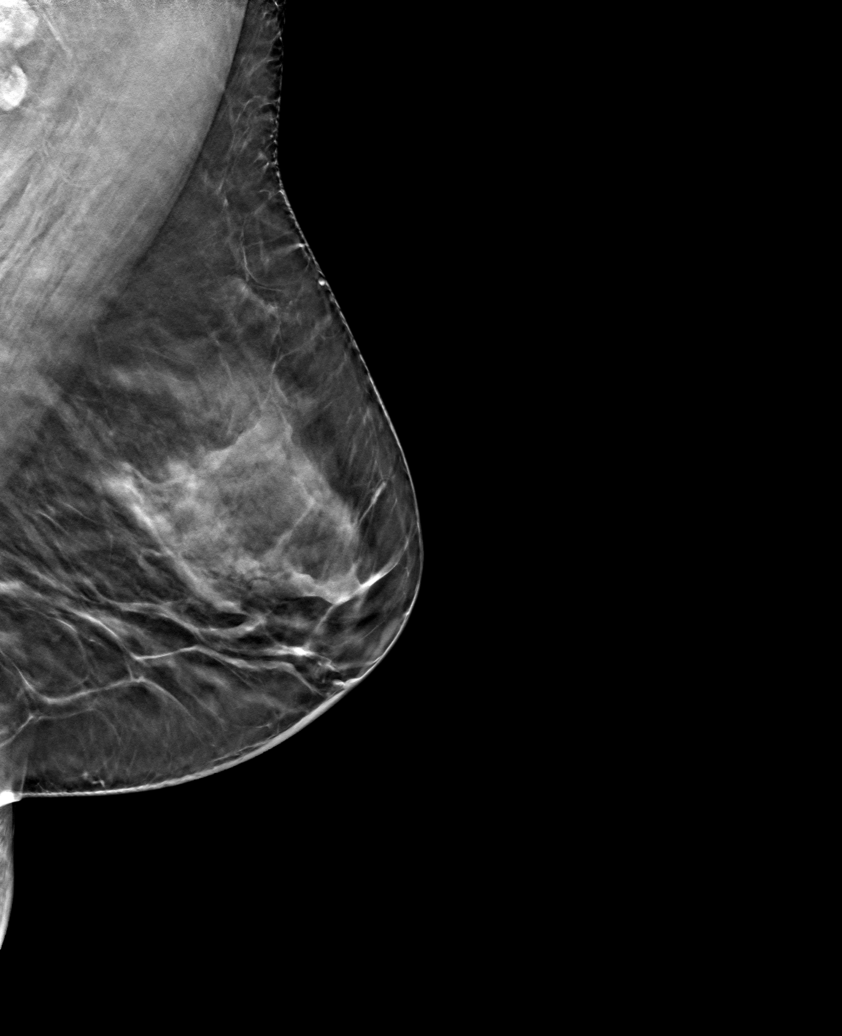

[L CC tomo · tomo slice 27/52.0]
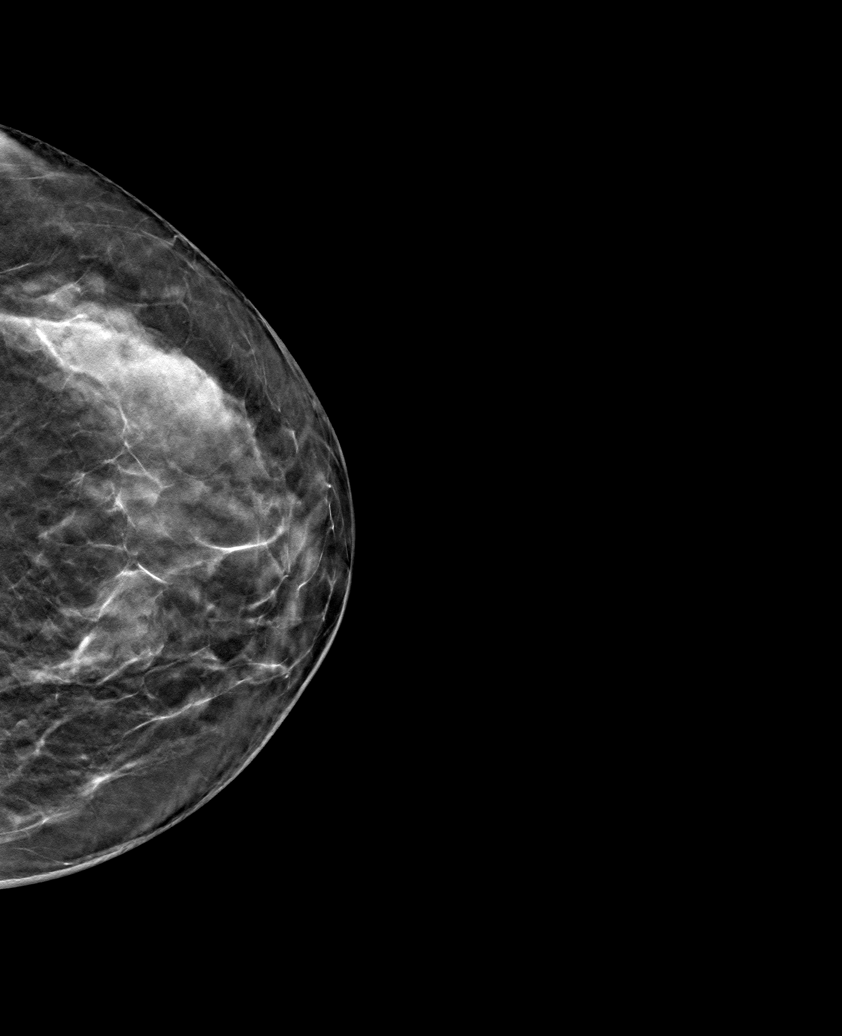

[6 of 18 positions shown; findings below may reference images not displayed]

ACR Breast Density Category c: The breast tissue is heterogeneously
dense, which may obscure small masses.
FINDINGS: Possible mass or masses noted in the lateral aspect of the left
breast, which appear stable from prior studies. There are no other
masses, no areas of architectural distortion and no suspicious
calcifications. No mammographic evidence of nipple inversion.

Mammographic images were processed with CAD.

On physical exam, the nipple does not appear inverted. No palpable
retroareolar mass.

Targeted ultrasound is performed, showing normal tissue in the
retroareolar region of the left breast. In the 2:30 o'clock position
of the left breast, 3 cm from the nipple, there is a cluster of
cysts or cyst with thin septations, measuring 2.4 x 0.4 x 1.5 cm.
There are no solid masses or suspicious lesions.
IMPRESSION: 1. No evidence of breast malignancy.
2. Benign cysts in the lateral left breast.

RECOMMENDATION:
Screening mammogram in October 2018.(Code:W4-R-K5T)

I have discussed the findings and recommendations with the patient.
Results were also provided in writing at the conclusion of the
visit. If applicable, a reminder letter will be sent to the patient
regarding the next appointment.

BI-RADS CATEGORY  2: Benign.

## 2022-03-12 ENCOUNTER — Other Ambulatory Visit: Payer: Self-pay | Admitting: Obstetrics and Gynecology

## 2022-03-12 DIAGNOSIS — Z13820 Encounter for screening for osteoporosis: Secondary | ICD-10-CM

## 2022-04-13 ENCOUNTER — Other Ambulatory Visit: Payer: PRIVATE HEALTH INSURANCE

## 2022-05-11 ENCOUNTER — Ambulatory Visit
Admission: RE | Admit: 2022-05-11 | Discharge: 2022-05-11 | Disposition: A | Discharge status: Home / Self Care | Payer: Managed Care, Other (non HMO) | Source: Ambulatory Visit | Attending: Obstetrics and Gynecology | Admitting: Obstetrics and Gynecology

## 2022-05-11 DIAGNOSIS — Z13820 Encounter for screening for osteoporosis: Secondary | ICD-10-CM
